# Patient Record
Sex: Female | Born: 2000 | Race: Black or African American | Hispanic: No | Marital: Single | Smoking: Never smoker
Health system: Southern US, Community
[De-identification: ages and names within clinical notes are randomized; demographics above are authoritative.]

---
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
## 2022-07-18 ENCOUNTER — Encounter (HOSPITAL_COMMUNITY): Payer: Self-pay | Admitting: Emergency Medicine

## 2022-07-18 ENCOUNTER — Emergency Department (HOSPITAL_COMMUNITY): Payer: Medicaid Other

## 2022-07-18 ENCOUNTER — Other Ambulatory Visit: Payer: Self-pay

## 2022-07-18 ENCOUNTER — Emergency Department (HOSPITAL_COMMUNITY)
Admission: EM | Admit: 2022-07-18 | Discharge: 2022-07-18 | Disposition: A | Discharge status: Home / Self Care | Payer: Medicaid Other | Attending: Emergency Medicine | Admitting: Emergency Medicine

## 2022-07-18 DIAGNOSIS — Y92009 Unspecified place in unspecified non-institutional (private) residence as the place of occurrence of the external cause: Secondary | ICD-10-CM | POA: Diagnosis not present

## 2022-07-18 DIAGNOSIS — M25561 Pain in right knee: Secondary | ICD-10-CM

## 2022-07-18 DIAGNOSIS — R519 Headache, unspecified: Secondary | ICD-10-CM | POA: Insufficient documentation

## 2022-07-18 DIAGNOSIS — R220 Localized swelling, mass and lump, head: Secondary | ICD-10-CM | POA: Insufficient documentation

## 2022-07-18 MED ORDER — NAPROXEN 500 MG PO TABS: 500.0000 mg | ORAL_TABLET | Freq: Two times a day (BID) | ORAL | 0 refills | Status: DC

## 2022-07-18 MED ORDER — NAPROXEN 500 MG PO TABS: 500.0000 mg | ORAL_TABLET | Freq: Two times a day (BID) | ORAL | 0 refills | Status: AC

## 2022-07-18 MED ORDER — ACETAMINOPHEN 500 MG PO TABS
1000.0000 mg | ORAL_TABLET | Freq: Once | ORAL | Status: DC
  Filled 2022-07-18: qty 2

## 2022-07-18 MED ORDER — OXYCODONE HCL 5 MG PO TABS
5.0000 mg | ORAL_TABLET | Freq: Once | ORAL | Status: AC
  Administered 2022-07-18: 5 mg via ORAL
  Filled 2022-07-18: qty 1
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
## 2022-07-18 NOTE — Discharge Instructions (Addendum)
You were placed on a knee immobilizer today, please keep this in place until your appointment with Dr. Shon Baton.  You will need to apply ice, elevate and take the anti inflammatories
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
## 2022-07-21 ENCOUNTER — Other Ambulatory Visit: Payer: Self-pay

## 2022-07-21 ENCOUNTER — Emergency Department (HOSPITAL_COMMUNITY)
Admission: EM | Admit: 2022-07-21 | Discharge: 2022-07-21 | Disposition: A | Discharge status: Home / Self Care | Payer: Medicaid Other | Attending: Emergency Medicine | Admitting: Emergency Medicine

## 2022-07-21 ENCOUNTER — Encounter (HOSPITAL_COMMUNITY): Payer: Self-pay

## 2022-07-21 DIAGNOSIS — N12 Tubulo-interstitial nephritis, not specified as acute or chronic: Secondary | ICD-10-CM | POA: Insufficient documentation

## 2022-07-21 DIAGNOSIS — R300 Dysuria: Secondary | ICD-10-CM | POA: Diagnosis present

## 2022-07-21 LAB — URINALYSIS, W/ REFLEX TO CULTURE (INFECTION SUSPECTED)
Bilirubin Urine: NEGATIVE
Glucose, UA: NEGATIVE mg/dL
Ketones, ur: NEGATIVE mg/dL
Nitrite: NEGATIVE
Protein, ur: 100 mg/dL — AB
RBC / HPF: >50 RBC/hpf (ref 0–5)
Specific Gravity, Urine: 1.017 (ref 1.005–1.030)
WBC, UA: >50 WBC/hpf (ref 0–5)
pH: 6 (ref 5.0–8.0)

## 2022-07-21 LAB — PREGNANCY, URINE: Preg Test, Ur: NEGATIVE

## 2022-07-21 MED ORDER — ONDANSETRON 4 MG PO TBDP
4.0000 mg | ORAL_TABLET | Freq: Once | ORAL | Status: AC
  Administered 2022-07-21: 4 mg via ORAL
  Filled 2022-07-21: qty 1

## 2022-07-21 MED ORDER — PHENAZOPYRIDINE HCL 100 MG PO TABS
100.0000 mg | ORAL_TABLET | Freq: Three times a day (TID) | ORAL | Status: AC
  Administered 2022-07-21: 100 mg via ORAL
  Filled 2022-07-21: qty 1

## 2022-07-21 MED ORDER — CEPHALEXIN 500 MG PO CAPS
500.0000 mg | ORAL_CAPSULE | Freq: Once | ORAL | Status: AC
  Administered 2022-07-21: 500 mg via ORAL
  Filled 2022-07-21: qty 1

## 2022-07-21 MED ORDER — ONDANSETRON 4 MG PO TBDP: 4.0000 mg | ORAL_TABLET | Freq: Three times a day (TID) | ORAL | 0 refills | Status: AC | PRN

## 2022-07-21 MED ORDER — PHENAZOPYRIDINE HCL 200 MG PO TABS: 200.0000 mg | ORAL_TABLET | Freq: Three times a day (TID) | ORAL | 0 refills | Status: AC

## 2022-07-21 MED ORDER — CEPHALEXIN 500 MG PO CAPS: 500.0000 mg | ORAL_CAPSULE | Freq: Three times a day (TID) | ORAL | 0 refills | Status: AC

## 2022-07-21 NOTE — Discharge Instructions (Signed)
It was a pleasure taking care of you here in the emergency department  You look like you have a urinary tract infection which we have treated.  You may start taking this medication tomorrow.  We have written for antibiotics, nausea medicine as well as medication to help with your UTI symptoms.  Return for new or worsening symptoms
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
## 2022-07-21 NOTE — ED Triage Notes (Signed)
Patient has had burning urination for the last couple of days. Thinks she has a UTI.
